# Patient Record
Sex: Male | Born: 1985 | Race: White | Hispanic: No | Marital: Single | Smoking: Current some day smoker
Health system: Southern US, Community
[De-identification: ages and names within clinical notes are randomized; demographics above are authoritative.]

## PROBLEM LIST (undated history)

## (undated) DIAGNOSIS — H6120 Impacted cerumen, unspecified ear: Secondary | ICD-10-CM

## (undated) HISTORY — DX: Impacted cerumen, unspecified ear: H61.20

## (undated) HISTORY — PX: WISDOM TOOTH EXTRACTION: SHX21

## (undated) HISTORY — PX: ANKLE FRACTURE SURGERY: SHX122

---
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
## 2005-04-19 ENCOUNTER — Emergency Department (HOSPITAL_COMMUNITY): Admission: EM | Admit: 2005-04-19 | Discharge: 2005-04-19 | Payer: Self-pay | Admitting: Emergency Medicine

## 2005-04-24 ENCOUNTER — Ambulatory Visit (HOSPITAL_COMMUNITY): Admission: RE | Admit: 2005-04-24 | Discharge: 2005-04-24 | Payer: Self-pay | Admitting: Pediatrics

## 2005-05-17 ENCOUNTER — Ambulatory Visit (HOSPITAL_COMMUNITY): Admission: RE | Admit: 2005-05-17 | Discharge: 2005-05-17 | Payer: Self-pay | Admitting: Pediatrics
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
## 2017-01-17 ENCOUNTER — Encounter (HOSPITAL_COMMUNITY): Payer: Self-pay | Admitting: Emergency Medicine

## 2017-01-17 ENCOUNTER — Emergency Department (HOSPITAL_COMMUNITY)
Admission: EM | Admit: 2017-01-17 | Discharge: 2017-01-17 | Disposition: A | Discharge status: Home / Self Care | Payer: Worker's Compensation | Attending: Emergency Medicine | Admitting: Emergency Medicine

## 2017-01-17 DIAGNOSIS — Y990 Civilian activity done for income or pay: Secondary | ICD-10-CM | POA: Diagnosis not present

## 2017-01-17 DIAGNOSIS — T22011A Burn of unspecified degree of right forearm, initial encounter: Secondary | ICD-10-CM | POA: Diagnosis present

## 2017-01-17 DIAGNOSIS — Y92000 Kitchen of unspecified non-institutional (private) residence as  the place of occurrence of the external cause: Secondary | ICD-10-CM | POA: Insufficient documentation

## 2017-01-17 DIAGNOSIS — T22212A Burn of second degree of left forearm, initial encounter: Secondary | ICD-10-CM | POA: Diagnosis not present

## 2017-01-17 DIAGNOSIS — X102XXA Contact with fats and cooking oils, initial encounter: Secondary | ICD-10-CM | POA: Diagnosis not present

## 2017-01-17 DIAGNOSIS — F17200 Nicotine dependence, unspecified, uncomplicated: Secondary | ICD-10-CM | POA: Insufficient documentation

## 2017-01-17 DIAGNOSIS — T22211A Burn of second degree of right forearm, initial encounter: Secondary | ICD-10-CM | POA: Diagnosis not present

## 2017-01-17 DIAGNOSIS — Y9389 Activity, other specified: Secondary | ICD-10-CM | POA: Diagnosis not present

## 2017-01-17 MED ORDER — SILVER SULFADIAZINE 1 % EX CREA: 1.0000 "application " | TOPICAL_CREAM | Freq: Every day | CUTANEOUS | 0 refills | Status: AC

## 2017-01-17 MED ORDER — SILVER SULFADIAZINE 1 % EX CREA
TOPICAL_CREAM | Freq: Once | CUTANEOUS | Status: AC
  Administered 2017-01-17: 1 via TOPICAL
  Filled 2017-01-17: qty 50

## 2017-01-17 MED ORDER — OXYCODONE-ACETAMINOPHEN 5-325 MG PO TABS: 1.0000 | ORAL_TABLET | Freq: Four times a day (QID) | ORAL | 0 refills | Status: DC | PRN

## 2017-01-17 MED ORDER — OXYCODONE-ACETAMINOPHEN 5-325 MG PO TABS
1.0000 | ORAL_TABLET | Freq: Once | ORAL | Status: AC
  Administered 2017-01-17: 1 via ORAL
  Filled 2017-01-17: qty 1
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
## 2017-01-17 NOTE — Discharge Instructions (Signed)
Clean wound area daily, apply silvadene cream and monitor for signs of infection.  Take percocet as needed for pain.  Return if your condition worsen or if you have other concerns.
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
## 2017-01-20 ENCOUNTER — Emergency Department (HOSPITAL_COMMUNITY)
Admission: EM | Admit: 2017-01-20 | Discharge: 2017-01-20 | Disposition: A | Discharge status: Home / Self Care | Payer: Worker's Compensation | Attending: Emergency Medicine | Admitting: Emergency Medicine

## 2017-01-20 ENCOUNTER — Encounter (HOSPITAL_COMMUNITY): Payer: Self-pay | Admitting: Emergency Medicine

## 2017-01-20 DIAGNOSIS — Z79899 Other long term (current) drug therapy: Secondary | ICD-10-CM | POA: Insufficient documentation

## 2017-01-20 DIAGNOSIS — T22211D Burn of second degree of right forearm, subsequent encounter: Secondary | ICD-10-CM | POA: Diagnosis not present

## 2017-01-20 DIAGNOSIS — T22211A Burn of second degree of right forearm, initial encounter: Secondary | ICD-10-CM

## 2017-01-20 DIAGNOSIS — F17200 Nicotine dependence, unspecified, uncomplicated: Secondary | ICD-10-CM | POA: Insufficient documentation

## 2017-01-20 DIAGNOSIS — Z4800 Encounter for change or removal of nonsurgical wound dressing: Secondary | ICD-10-CM | POA: Diagnosis present

## 2017-01-20 DIAGNOSIS — X19XXXD Contact with other heat and hot substances, subsequent encounter: Secondary | ICD-10-CM | POA: Diagnosis not present

## 2017-01-20 MED ORDER — OXYCODONE-ACETAMINOPHEN 5-325 MG PO TABS: 1.0000 | ORAL_TABLET | Freq: Four times a day (QID) | ORAL | 0 refills | Status: AC | PRN

## 2017-01-20 MED ORDER — OXYCODONE-ACETAMINOPHEN 5-325 MG PO TABS
2.0000 | ORAL_TABLET | Freq: Once | ORAL | Status: AC
  Administered 2017-01-20: 2 via ORAL
  Filled 2017-01-20: qty 2

## 2017-01-20 MED ORDER — BACITRACIN ZINC 500 UNIT/GM EX OINT
TOPICAL_OINTMENT | Freq: Two times a day (BID) | CUTANEOUS | Status: DC
  Administered 2017-01-20: 15:00:00 via TOPICAL

## 2017-01-20 NOTE — Discharge Instructions (Signed)
Medications: Percocet  Treatment: Take 1-2 Percocet every 4-6 hours as needed for severe pain. You can alternate with ibuprofen as prescribed over-the-counter. Apply antibiotic ointment or Silvadene with dressing changes. Please ask the burn center when you call which they prefer.  Follow-up: Please follow-up with the burn center by calling the number today when you leave. Please return to emergency department or see the burn center immediately if you develop any fevers, increasing redness spreading from the area, yellow drainage, or any other new or concerning symptoms.
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
## 2018-10-14 DIAGNOSIS — H6123 Impacted cerumen, bilateral: Secondary | ICD-10-CM | POA: Diagnosis not present

## 2018-10-14 DIAGNOSIS — F902 Attention-deficit hyperactivity disorder, combined type: Secondary | ICD-10-CM | POA: Diagnosis not present

## 2018-10-14 DIAGNOSIS — Z79899 Other long term (current) drug therapy: Secondary | ICD-10-CM | POA: Diagnosis not present

## 2018-10-16 DIAGNOSIS — M545 Low back pain: Secondary | ICD-10-CM | POA: Diagnosis not present

## 2019-01-06 DIAGNOSIS — M62830 Muscle spasm of back: Secondary | ICD-10-CM | POA: Diagnosis not present

## 2019-01-20 DIAGNOSIS — Z79899 Other long term (current) drug therapy: Secondary | ICD-10-CM | POA: Diagnosis not present

## 2019-01-20 DIAGNOSIS — F902 Attention-deficit hyperactivity disorder, combined type: Secondary | ICD-10-CM | POA: Diagnosis not present

## 2019-04-06 DIAGNOSIS — S90861A Insect bite (nonvenomous), right foot, initial encounter: Secondary | ICD-10-CM | POA: Diagnosis not present

## 2019-04-19 DIAGNOSIS — F900 Attention-deficit hyperactivity disorder, predominantly inattentive type: Secondary | ICD-10-CM | POA: Diagnosis not present

## 2019-04-19 DIAGNOSIS — F419 Anxiety disorder, unspecified: Secondary | ICD-10-CM | POA: Diagnosis not present

## 2019-04-19 DIAGNOSIS — F952 Tourette's disorder: Secondary | ICD-10-CM | POA: Diagnosis not present

## 2019-07-01 ENCOUNTER — Encounter (INDEPENDENT_AMBULATORY_CARE_PROVIDER_SITE_OTHER): Payer: Self-pay

## 2020-05-12 ENCOUNTER — Other Ambulatory Visit: Payer: Self-pay | Admitting: Family Medicine

## 2020-05-12 ENCOUNTER — Other Ambulatory Visit: Payer: Self-pay | Admitting: Gastroenterology

## 2020-05-12 DIAGNOSIS — R109 Unspecified abdominal pain: Secondary | ICD-10-CM

## 2023-10-30 ENCOUNTER — Ambulatory Visit
Admission: RE | Admit: 2023-10-30 | Discharge: 2023-10-30 | Disposition: A | Discharge status: Home / Self Care | Payer: Worker's Compensation | Source: Ambulatory Visit | Attending: Physician Assistant | Admitting: Physician Assistant

## 2023-10-30 ENCOUNTER — Other Ambulatory Visit: Payer: Self-pay | Admitting: Physician Assistant

## 2023-10-30 DIAGNOSIS — W000XXA Fall on same level due to ice and snow, initial encounter: Secondary | ICD-10-CM | POA: Diagnosis not present

## 2023-10-30 DIAGNOSIS — S82832A Other fracture of upper and lower end of left fibula, initial encounter for closed fracture: Secondary | ICD-10-CM | POA: Diagnosis not present

## 2023-10-30 DIAGNOSIS — S99912A Unspecified injury of left ankle, initial encounter: Secondary | ICD-10-CM | POA: Diagnosis present

## 2023-10-30 DIAGNOSIS — Y990 Civilian activity done for income or pay: Secondary | ICD-10-CM | POA: Insufficient documentation

## 2024-02-19 ENCOUNTER — Other Ambulatory Visit (HOSPITAL_BASED_OUTPATIENT_CLINIC_OR_DEPARTMENT_OTHER): Payer: Self-pay
